# Patient Record
Sex: Female | Born: 1970 | Race: White | Hispanic: No | Marital: Married | Smoking: Never smoker
Health system: Southern US, Community
[De-identification: ages and names within clinical notes are randomized; demographics above are authoritative.]

## PROBLEM LIST (undated history)

## (undated) DIAGNOSIS — I10 Essential (primary) hypertension: Secondary | ICD-10-CM

---

## 1997-10-01 ENCOUNTER — Observation Stay (HOSPITAL_COMMUNITY): Admission: AD | Admit: 1997-10-01 | Discharge: 1997-10-01 | Payer: Self-pay | Admitting: Obstetrics and Gynecology

## 1997-10-13 ENCOUNTER — Other Ambulatory Visit: Admission: RE | Admit: 1997-10-13 | Discharge: 1997-10-13 | Payer: Self-pay | Admitting: Obstetrics and Gynecology

## 1997-10-25 ENCOUNTER — Ambulatory Visit (HOSPITAL_COMMUNITY): Admission: AD | Admit: 1997-10-25 | Discharge: 1997-10-25 | Payer: Self-pay | Admitting: *Deleted

## 1998-06-20 ENCOUNTER — Other Ambulatory Visit: Admission: RE | Admit: 1998-06-20 | Discharge: 1998-06-20 | Payer: Self-pay | Admitting: Obstetrics and Gynecology

## 1999-01-27 ENCOUNTER — Inpatient Hospital Stay (HOSPITAL_COMMUNITY): Admission: AD | Admit: 1999-01-27 | Discharge: 1999-01-29 | Payer: Self-pay | Admitting: Obstetrics and Gynecology

## 1999-03-01 ENCOUNTER — Other Ambulatory Visit: Admission: RE | Admit: 1999-03-01 | Discharge: 1999-03-01 | Payer: Self-pay | Admitting: Obstetrics and Gynecology

## 2000-06-03 ENCOUNTER — Other Ambulatory Visit: Admission: RE | Admit: 2000-06-03 | Discharge: 2000-06-03 | Payer: Self-pay | Admitting: Obstetrics and Gynecology

## 2000-08-21 ENCOUNTER — Encounter: Admission: RE | Admit: 2000-08-21 | Discharge: 2000-11-19 | Payer: Self-pay | Admitting: Obstetrics and Gynecology

## 2002-06-11 ENCOUNTER — Other Ambulatory Visit: Admission: RE | Admit: 2002-06-11 | Discharge: 2002-06-11 | Payer: Self-pay | Admitting: Obstetrics and Gynecology

## 2002-11-24 ENCOUNTER — Inpatient Hospital Stay (HOSPITAL_COMMUNITY): Admission: AD | Admit: 2002-11-24 | Discharge: 2002-11-28 | Payer: Self-pay | Admitting: Obstetrics and Gynecology

## 2002-12-09 ENCOUNTER — Inpatient Hospital Stay (HOSPITAL_COMMUNITY): Admission: AD | Admit: 2002-12-09 | Discharge: 2002-12-09 | Payer: Self-pay | Admitting: Obstetrics and Gynecology

## 2003-01-08 ENCOUNTER — Other Ambulatory Visit: Admission: RE | Admit: 2003-01-08 | Discharge: 2003-01-08 | Payer: Self-pay | Admitting: Obstetrics and Gynecology

## 2010-10-26 ENCOUNTER — Other Ambulatory Visit: Payer: Self-pay | Admitting: Obstetrics and Gynecology

## 2011-06-18 DIAGNOSIS — L259 Unspecified contact dermatitis, unspecified cause: Secondary | ICD-10-CM | POA: Insufficient documentation

## 2012-02-13 ENCOUNTER — Ambulatory Visit (HOSPITAL_COMMUNITY): Payer: Self-pay

## 2012-02-14 ENCOUNTER — Ambulatory Visit (HOSPITAL_COMMUNITY)
Admission: RE | Admit: 2012-02-14 | Discharge: 2012-02-14 | Disposition: A | Discharge status: Home / Self Care | Payer: Managed Care, Other (non HMO) | Source: Ambulatory Visit | Attending: Obstetrics and Gynecology | Admitting: Obstetrics and Gynecology

## 2012-02-14 DIAGNOSIS — I10 Essential (primary) hypertension: Secondary | ICD-10-CM | POA: Insufficient documentation

## 2012-02-15 ENCOUNTER — Ambulatory Visit (HOSPITAL_COMMUNITY): Admission: RE | Admit: 2012-02-15 | Payer: Self-pay | Source: Ambulatory Visit

## 2015-07-20 ENCOUNTER — Other Ambulatory Visit: Payer: Self-pay | Admitting: Physician Assistant

## 2017-04-29 ENCOUNTER — Emergency Department (HOSPITAL_BASED_OUTPATIENT_CLINIC_OR_DEPARTMENT_OTHER): Payer: Managed Care, Other (non HMO)

## 2017-04-29 ENCOUNTER — Emergency Department (HOSPITAL_BASED_OUTPATIENT_CLINIC_OR_DEPARTMENT_OTHER)
Admission: EM | Admit: 2017-04-29 | Discharge: 2017-04-29 | Disposition: A | Discharge status: Home / Self Care | Payer: Managed Care, Other (non HMO) | Attending: Emergency Medicine | Admitting: Emergency Medicine

## 2017-04-29 ENCOUNTER — Other Ambulatory Visit: Payer: Self-pay

## 2017-04-29 ENCOUNTER — Encounter (HOSPITAL_BASED_OUTPATIENT_CLINIC_OR_DEPARTMENT_OTHER): Payer: Self-pay | Admitting: *Deleted

## 2017-04-29 DIAGNOSIS — I10 Essential (primary) hypertension: Secondary | ICD-10-CM | POA: Insufficient documentation

## 2017-04-29 DIAGNOSIS — L03115 Cellulitis of right lower limb: Secondary | ICD-10-CM | POA: Insufficient documentation

## 2017-04-29 DIAGNOSIS — M79604 Pain in right leg: Secondary | ICD-10-CM | POA: Diagnosis present

## 2017-04-29 HISTORY — DX: Essential (primary) hypertension: I10

## 2017-04-29 MED ORDER — CEPHALEXIN 500 MG PO CAPS: 500.0000 mg | ORAL_CAPSULE | Freq: Four times a day (QID) | ORAL | 0 refills | Status: DC

## 2017-04-29 NOTE — ED Triage Notes (Signed)
Right knee was swollen last week. The swelling went down and now her upper leg is swollen. She has a rash in one area.

## 2017-04-29 NOTE — ED Provider Notes (Signed)
[facility] EMERGENCY DEPARTMENT Provider Note   CSN: [id number] Arrival date & time: 04/29/17  1641     History   Chief Complaint Chief Complaint  Patient presents with  . Leg Pain    HPI Stacy Rivers is a 46 y.o. female w/ h/o vericose veins presents to ED for evaluation of focal area of redness, warmth, tenderness to right medial mid thigh x 2 days. She feels like there is a knot under the skin. States one week ago she hit her right knee and it swelled up, turned purple/blue and now it feels like there is also a  knot above her right knee cap. She is afraid she has a blood clot. Denies fevers, chills, trauma to mid thigh, previous h/o DVT/PE.  No numbness or tingling distally. Denies IVDU. States she has a thick rope like vein in this area of her thigh normally gets larger when standing up for prolonged periods of time. Has been told to go to vascular but has not because it usually doesn't bother her. No recent immobilization, estrogen use, h/o malignancy.  HPI  Past Medical History:  Diagnosis Date  . Hypertension     There are no active problems to display for this patient.   Past Surgical History:  Procedure Laterality Date  . CESAREAN SECTION      OB History    No data available       Home Medications    Prior to Admission medications   Medication Sig Start Date End Date Taking? Authorizing Provider  LISINOPRIL PO Take by mouth.   Yes [provider]  cephALEXin (KEFLEX) 500 MG capsule Take 1 capsule (500 mg total) by mouth 4 (four) times daily. 04/29/17   Liberty Handy, PA-C    Family History No family history on file.  Social History Social History   Tobacco Use  . Smoking status: Never Smoker  . Smokeless tobacco: Never Used  Substance Use Topics  . Alcohol use: No    Frequency: Never  . Drug use: No     Allergies   Patient has no known allergies.   Review of Systems Review of Systems  Skin: Positive for color  change.       +swelling +warmth  All other systems reviewed and are negative.    Physical Exam Updated Vital Signs BP (!) 143/96 (BP Location: Left Arm)   Pulse 73   Temp 98.4 F (36.9 C) (Oral)   Resp 18   Ht 5\' 6"  (1.676 m)   Wt 86.2 kg (190 lb)   LMP 04/07/2017   SpO2 99%   BMI 30.67 kg/m   Physical Exam  Constitutional: She is oriented to person, place, and time. She appears well-developed and well-nourished. No distress.  NAD.  HENT:  Head: Normocephalic and atraumatic.  Right Ear: External ear normal.  Left Ear: External ear normal.  Nose: Nose normal.  Eyes: Conjunctivae and EOM are normal. No scleral icterus.  Neck: Normal range of motion. Neck supple.  Cardiovascular: Normal rate, regular rhythm and normal heart sounds.  No murmur heard. Pulmonary/Chest: Effort normal and breath sounds normal. She has no wheezes.  Musculoskeletal: Normal range of motion. She exhibits no deformity.  Neurological: She is alert and oriented to person, place, and time.  Skin: Skin is warm and dry. Capillary refill takes less than 2 seconds. There is erythema.  Area of erythema and warmth to right medial mid thigh with well demarcated borders, blanchable. There is area  of induration/firmness in the center. No obvious superficial abscess. No visible vericosities. No calf edema or tenderness. No LE edema   Psychiatric: She has a normal mood and affect. Her behavior is normal. Judgment and thought content normal.  Nursing note and vitals reviewed.    ED Treatments / Results  Labs (all labs ordered are listed, but only abnormal results are displayed) Labs Reviewed - No data to display  EKG  EKG Interpretation None       Radiology [place] Venous Img Lower Right (dvt Study)  Result Date: 04/29/2017 CLINICAL DATA:  RIGHT lower extremity pain and swelling for 2 days, history hypertension EXAM: RIGHT LOWER EXTREMITY VENOUS DOPPLER ULTRASOUND TECHNIQUE: Gray-scale sonography with graded  compression, as well as color Doppler and duplex ultrasound were performed to evaluate the lower extremity deep venous systems from the level of the common femoral vein and including the common femoral, femoral, profunda femoral, popliteal and calf veins including the posterior tibial, peroneal and gastrocnemius veins when visible. The superficial great saphenous vein was also interrogated. Spectral Doppler was utilized to evaluate flow at rest and with distal augmentation maneuvers in the common femoral, femoral and popliteal veins. COMPARISON:  None FINDINGS: Contralateral Common Femoral Vein: Respiratory phasicity is normal and symmetric with the symptomatic side. No evidence of thrombus. Normal compressibility. Common Femoral Vein: No evidence of thrombus. Normal compressibility, respiratory phasicity and response to augmentation. Saphenofemoral Junction: No evidence of thrombus. Normal compressibility and flow on color Doppler imaging. Profunda Femoral Vein: No evidence of thrombus. Normal compressibility and flow on color Doppler imaging. Femoral Vein: No evidence of thrombus. Normal compressibility, respiratory phasicity and response to augmentation. Popliteal Vein: No evidence of thrombus. Normal compressibility, respiratory phasicity and response to augmentation. Calf Veins: No evidence of thrombus. Normal compressibility and flow on color Doppler imaging. Superficial Great Saphenous Vein: No evidence of thrombus. Normal compressibility. Venous Reflux:  None. Other Findings:  None. IMPRESSION: No evidence of deep venous thrombosis in the RIGHT lower extremity. Electronically Signed   By: Ulyses SouthwardMark  Boles M.D.   On: 04/29/2017 20:13    Procedures Procedures (including critical care time)  Medications Ordered in ED Medications - No data to display   Initial Impression / Assessment and Plan / ED Course  I have reviewed the triage vital signs and the nursing notes.  Pertinent labs & imaging results that  were available during my care of the patient were reviewed by me and considered in my medical decision making (see chart for details).    46 year old presents with focal area of erythema, edema, warmth with induration/firmness to the center 2 days. No obvious fluctuance to suggest abscess that would be amenable to I&D today. Reports history of varicose veins to this area. No previous history of DVT/PE. No recent surgery, immobilization, malignancy, estrogen use. Extremities neurovascularly intact distally. No trauma. Denies IV drug use. History, exam was consistent with cellulitis. Given history of varicose veins and induration to the central part  Lower extremity ultrasound was obtained to rule out DVT. Ultrasound today was negative. Patient does shave her legs, suspect cellulitis. Will discharge with antibiotics. Area of erythema was marked with a pen today. Discussed return precautions.  Final Clinical Impressions(s) / ED Diagnoses   Final diagnoses:  Cellulitis of right lower extremity    ED Discharge Orders        Ordered    cephALEXin (KEFLEX) 500 MG capsule  4 times daily     04/29/17 2020  Liberty HandyGibbons, Shanta Dorvil J, PA-C 04/29/17 2025    Charlynne PanderYao, David Hsienta, MD 04/29/17 [phone]

## 2017-04-29 NOTE — Discharge Instructions (Addendum)
Ultrasound today was negative, there is no evidence of blood clots. I suspect the redness, warmth and tenderness to right leg is from a superficial skin infection, cellulitis. This can occur from introduction of bacteria into skin. Please take antibiotic as prescribed. Take ibuprofen and Tylenol for swelling and pain. Give antibiotics 2-3 days to decrease redness, if redness goes past the marked lines after 2-3 days of taking antibiotics please follow-up for reevaluation.

## 2018-03-07 IMAGING — US US EXTREM LOW VENOUS*R*
1 series · 13 of 24 positions shown · non-contrast
Comparison: None

CLINICAL DATA: RIGHT lower extremity pain and swelling for 2 days,
history hypertension



[Series 1: us extrem low venous*right* · 0.08mm/px · 13 of 27 slices shown]
[im 1/27]
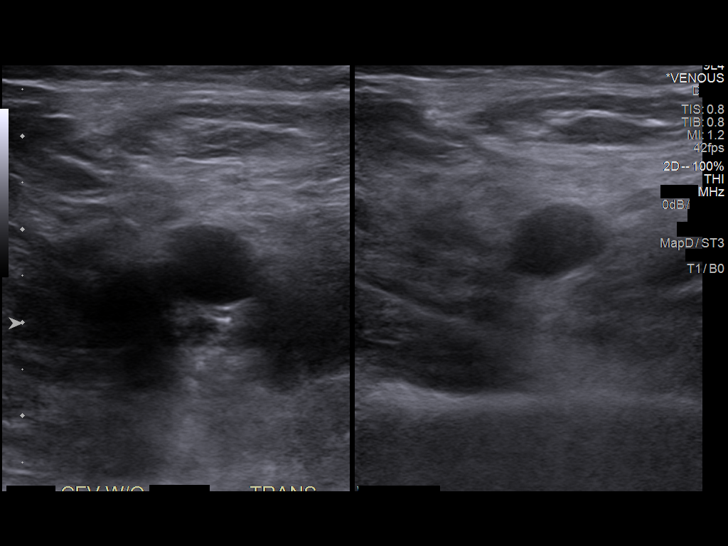
[im 3/27]
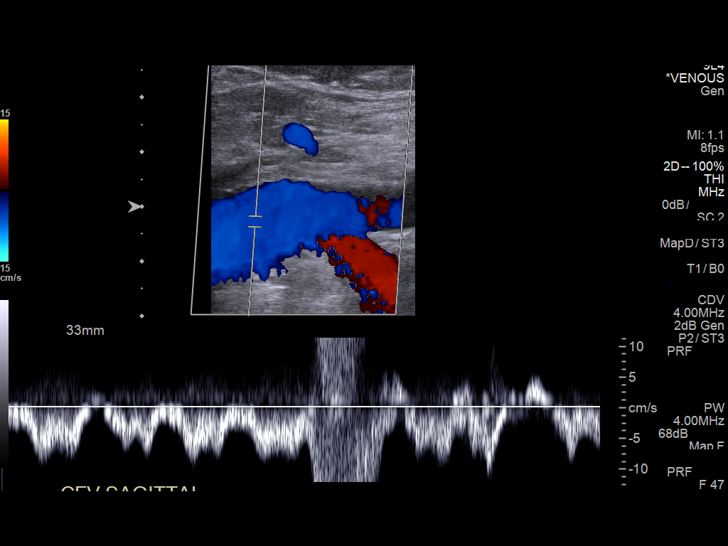
[im 5/27]
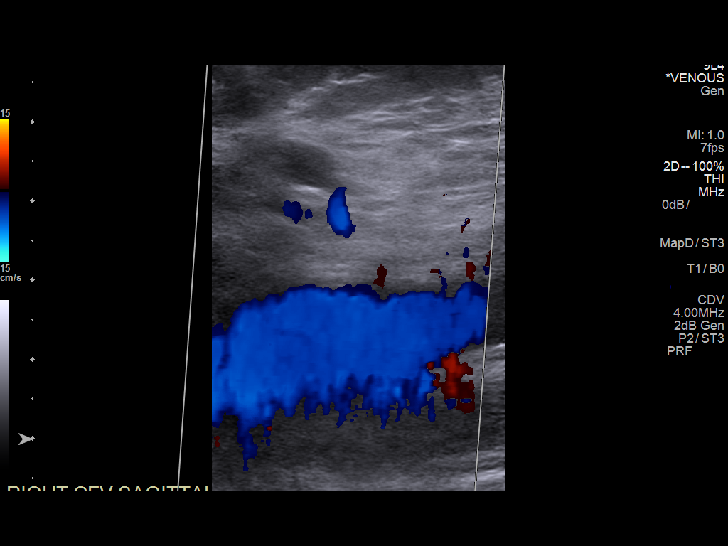
[im 7/27]
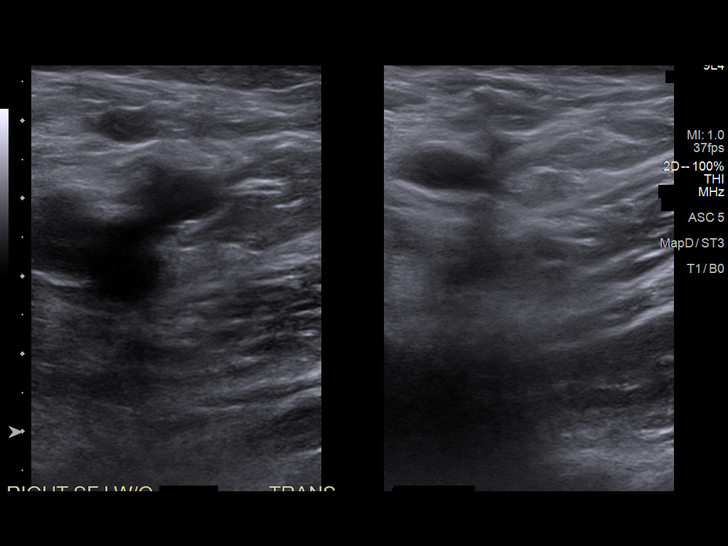
[im 10/27]
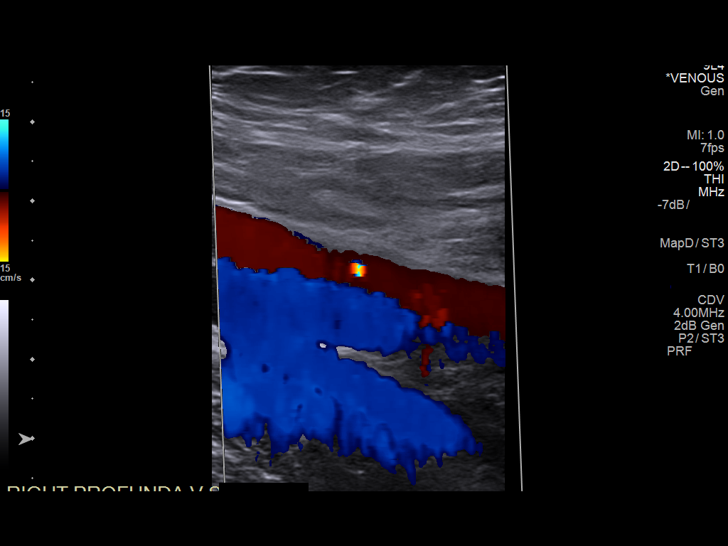
[im 12/27]
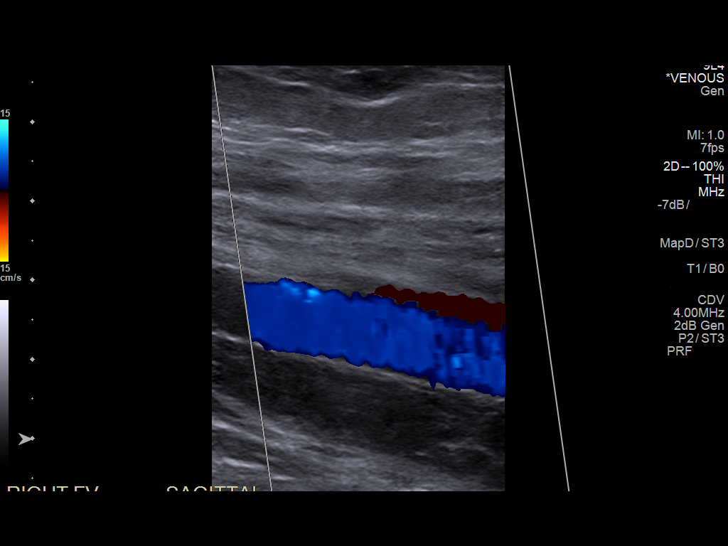
[im 14/27]
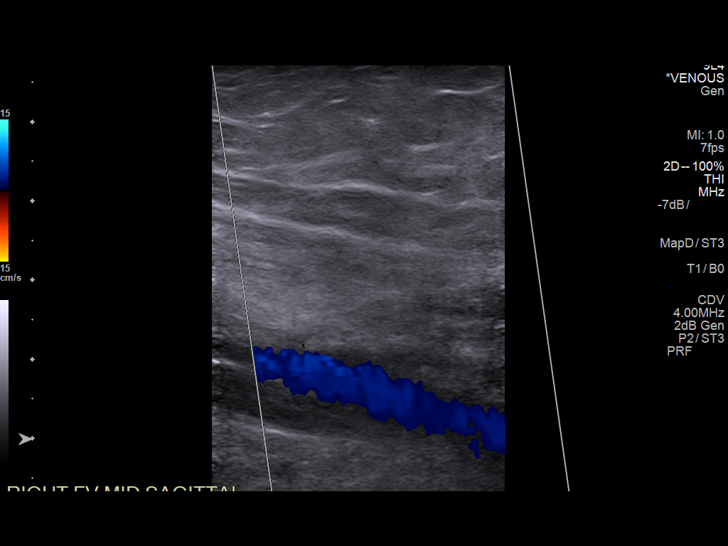
[im 15/27]
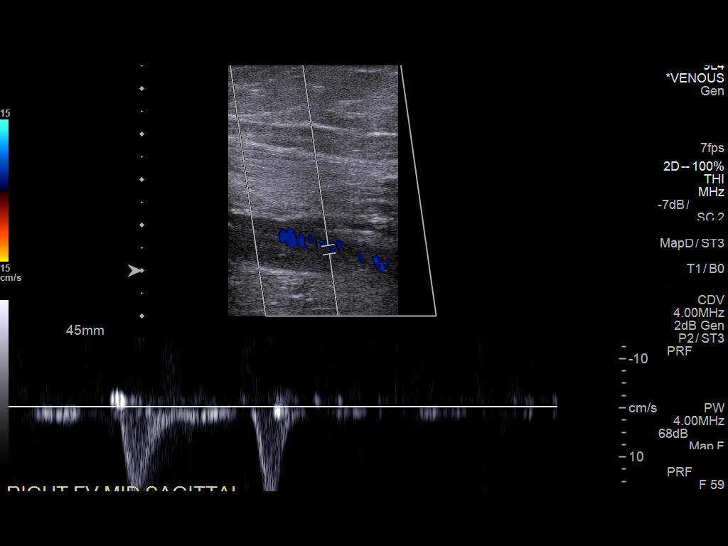
[im 17/27]
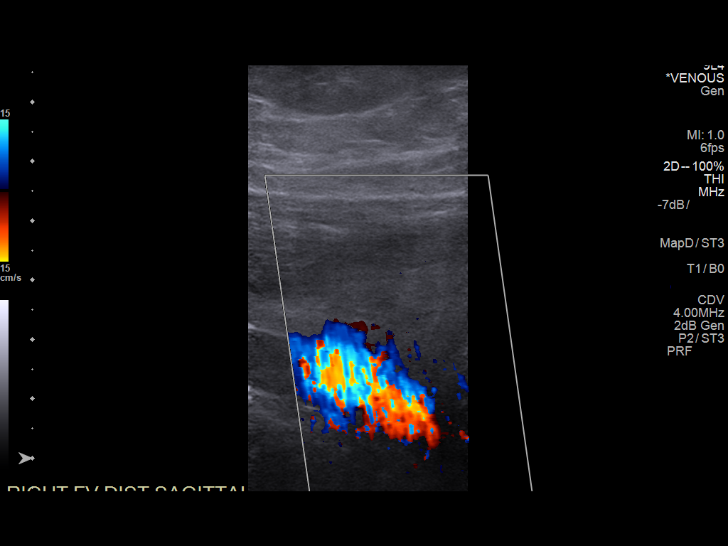
[im 20/27]
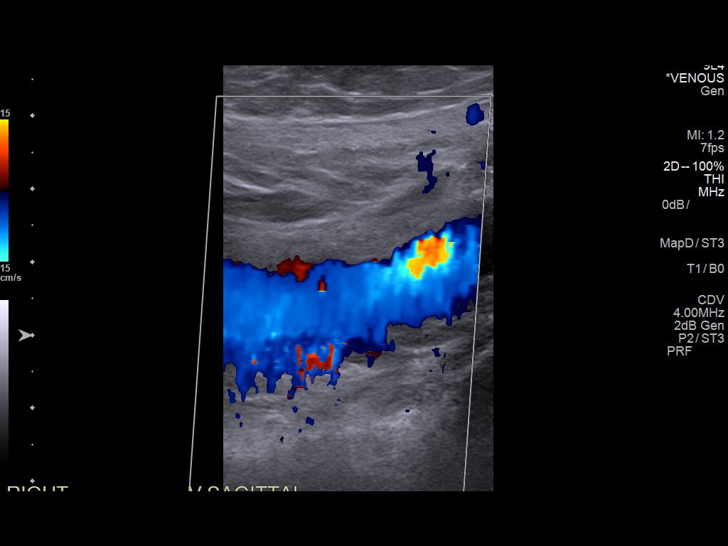
[im 22/27]
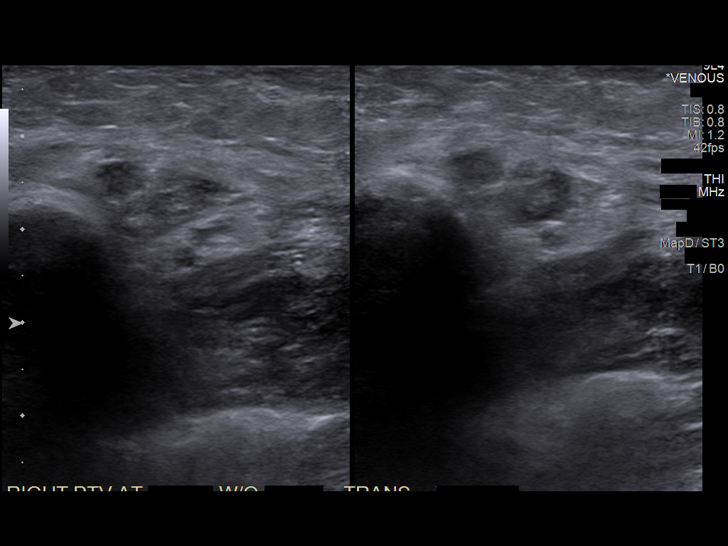
[im 24/27]
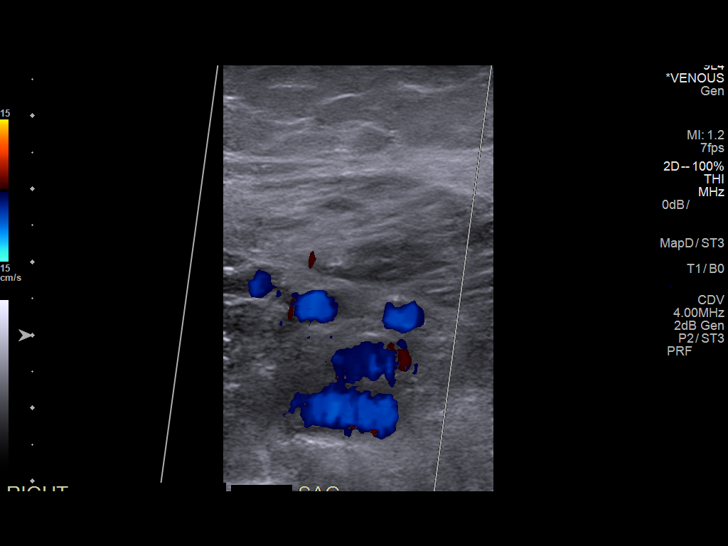
[im 27/27]
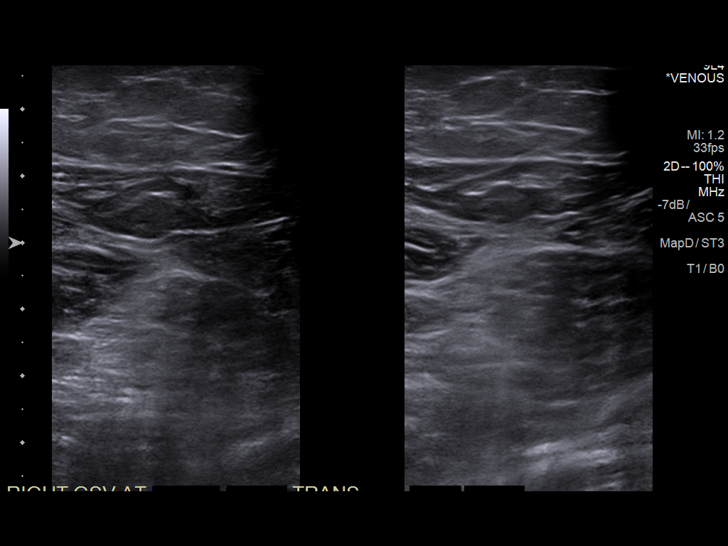

[13 of 24 positions shown; findings below may reference images not displayed]

FINDINGS: Contralateral Common Femoral Vein: Respiratory phasicity is normal
and symmetric with the symptomatic side. No evidence of thrombus.
Normal compressibility.

Common Femoral Vein: No evidence of thrombus. Normal
compressibility, respiratory phasicity and response to augmentation.

Saphenofemoral Junction: No evidence of thrombus. Normal
compressibility and flow on color Doppler imaging.

Profunda Femoral Vein: No evidence of thrombus. Normal
compressibility and flow on color Doppler imaging.

Femoral Vein: No evidence of thrombus. Normal compressibility,
respiratory phasicity and response to augmentation.

Popliteal Vein: No evidence of thrombus. Normal compressibility,
respiratory phasicity and response to augmentation.

Calf Veins: No evidence of thrombus. Normal compressibility and flow
on color Doppler imaging.

Superficial Great Saphenous Vein: No evidence of thrombus. Normal
compressibility.

Venous Reflux:  None.

Other Findings:  None.
IMPRESSION: No evidence of deep venous thrombosis in the RIGHT lower extremity.

## 2020-11-14 DIAGNOSIS — I10 Essential (primary) hypertension: Secondary | ICD-10-CM | POA: Insufficient documentation

## 2020-11-16 DIAGNOSIS — Z803 Family history of malignant neoplasm of breast: Secondary | ICD-10-CM | POA: Insufficient documentation

## 2020-11-16 DIAGNOSIS — N951 Menopausal and female climacteric states: Secondary | ICD-10-CM | POA: Insufficient documentation

## 2020-11-16 DIAGNOSIS — R61 Generalized hyperhidrosis: Secondary | ICD-10-CM | POA: Insufficient documentation

## 2020-12-20 LAB — COLOGUARD: COLOGUARD: NEGATIVE

## 2022-09-18 DIAGNOSIS — N8110 Cystocele, unspecified: Secondary | ICD-10-CM | POA: Insufficient documentation

## 2022-12-10 DIAGNOSIS — N951 Menopausal and female climacteric states: Secondary | ICD-10-CM | POA: Insufficient documentation

## 2024-01-02 DIAGNOSIS — Z1331 Encounter for screening for depression: Secondary | ICD-10-CM | POA: Diagnosis not present

## 2024-01-02 DIAGNOSIS — Z01419 Encounter for gynecological examination (general) (routine) without abnormal findings: Secondary | ICD-10-CM | POA: Diagnosis not present

## 2024-01-02 DIAGNOSIS — Z0000 Encounter for general adult medical examination without abnormal findings: Secondary | ICD-10-CM | POA: Diagnosis not present

## 2024-01-02 DIAGNOSIS — Z1231 Encounter for screening mammogram for malignant neoplasm of breast: Secondary | ICD-10-CM | POA: Diagnosis not present

## 2024-02-17 ENCOUNTER — Telehealth: Payer: Self-pay

## 2024-02-17 NOTE — Telephone Encounter (Signed)
 This patient mentioned at front desk she was told she would be able to establish as a new patient with you? Patient was with Husband at his appointment with you this morning Charlie Smalling.  Was this verified with you?

## 2024-02-17 NOTE — Telephone Encounter (Signed)
 Patient mentioned during husband's check out that she was approved to establish care as a NP. Please advise

## 2024-02-17 NOTE — Telephone Encounter (Signed)
 Ok to establish

## 2024-02-17 NOTE — Telephone Encounter (Signed)
 Call to schedule np appt - LM to return call

## 2024-02-19 ENCOUNTER — Encounter: Payer: Self-pay | Admitting: Gastroenterology

## 2024-02-19 NOTE — Telephone Encounter (Signed)
 Pt returned call and I got her scheduled

## 2024-02-28 ENCOUNTER — Ambulatory Visit (AMBULATORY_SURGERY_CENTER)

## 2024-02-28 VITALS — Ht 66.0 in | Wt 193.2 lb

## 2024-02-28 DIAGNOSIS — Z1211 Encounter for screening for malignant neoplasm of colon: Secondary | ICD-10-CM

## 2024-02-28 MED ORDER — NA SULFATE-K SULFATE-MG SULF 17.5-3.13-1.6 GM/177ML PO SOLN: 1.0000 | Freq: Once | ORAL | 0 refills | Status: AC

## 2024-02-28 NOTE — Progress Notes (Signed)

## 2024-03-04 ENCOUNTER — Encounter: Payer: Self-pay | Admitting: Gastroenterology

## 2024-03-16 ENCOUNTER — Encounter: Payer: Self-pay | Admitting: Gastroenterology

## 2024-03-16 ENCOUNTER — Ambulatory Visit (AMBULATORY_SURGERY_CENTER): Admitting: Gastroenterology

## 2024-03-16 VITALS — BP 143/77 | HR 63 | Temp 98.1°F | Resp 19 | Ht 66.0 in | Wt 193.2 lb

## 2024-03-16 DIAGNOSIS — Z1211 Encounter for screening for malignant neoplasm of colon: Secondary | ICD-10-CM | POA: Diagnosis not present

## 2024-03-16 DIAGNOSIS — K5730 Diverticulosis of large intestine without perforation or abscess without bleeding: Secondary | ICD-10-CM

## 2024-03-16 DIAGNOSIS — D123 Benign neoplasm of transverse colon: Secondary | ICD-10-CM

## 2024-03-16 MED ORDER — SODIUM CHLORIDE 0.9 % IV SOLN: 500.0000 mL | Freq: Once | INTRAVENOUS | Status: DC

## 2024-03-16 NOTE — Progress Notes (Signed)
 To pacu, VSS. Report to [email or web address]

## 2024-03-16 NOTE — Progress Notes (Signed)
 [facility] History and Physical   Primary Care Physician:  Kandyce Sor, MD   Reason for Procedure:   Colon cancer screening  Plan:    Screening colonoscopy     HPI: Stacy Rivers is a 53 y.o. female undergoing initial average risk screening colonoscopy.  She has no family history of colon cancer and no chronic GI symptoms.    Past Medical History:  Diagnosis Date   Hypertension     Past Surgical History:  Procedure Laterality Date   CESAREAN SECTION      Prior to Admission medications   Medication Sig Start Date End Date Taking? Authorizing Provider  cholecalciferol (VITAMIN D3) 25 MCG (1000 UNIT) tablet Take 1,000 Units by mouth daily.   Yes [provider]  lisinopril-hydrochlorothiazide (ZESTORETIC) 10-12.5 MG tablet Take 1 tablet by mouth daily.   Yes [provider]  LYLLANA 0.075 MG/24HR Place 1 patch onto the skin 2 (two) times a week. 01/02/24  Yes [provider]  Omega-3 Fatty Acids (FISH OIL) 1000 MG CAPS Take 2 g by mouth daily.   Yes [provider]  progesterone (PROMETRIUM) 100 MG capsule Take 100 mg by mouth at bedtime.   Yes [provider]  ibuprofen (ADVIL) 600 MG tablet Take 600 mg by mouth every 8 (eight) hours as needed. 03/16/22   [provider]    Current Outpatient Medications  Medication Sig Dispense Refill   cholecalciferol (VITAMIN D3) 25 MCG (1000 UNIT) tablet Take 1,000 Units by mouth daily.     lisinopril-hydrochlorothiazide (ZESTORETIC) 10-12.5 MG tablet Take 1 tablet by mouth daily.     LYLLANA 0.075 MG/24HR Place 1 patch onto the skin 2 (two) times a week.     Omega-3 Fatty Acids (FISH OIL) 1000 MG CAPS Take 2 g by mouth daily.     progesterone (PROMETRIUM) 100 MG capsule Take 100 mg by mouth at bedtime.     ibuprofen (ADVIL) 600 MG tablet Take 600 mg by mouth every 8 (eight) hours as needed.     Current Facility-Administered Medications  Medication Dose Route  Frequency Provider Last Rate Last Admin   0.9 %  sodium chloride infusion  500 mL Intravenous Once Stacy Glendia BRAVO, MD        Allergies as of 03/16/2024   (No Known Allergies)    Family History  Problem Relation Age of Onset   Colon cancer Neg Hx    Rectal cancer Neg Hx    Stomach cancer Neg Hx    Esophageal cancer Neg Hx     Social History   Socioeconomic History   Marital status: Married    Spouse name: Not on file   Number of children: Not on file   Years of education: Not on file   Highest education level: Not on file  Occupational History   Not on file  Tobacco Use   Smoking status: Never   Smokeless tobacco: Never  Vaping Use   Vaping status: Never Used  Substance and Sexual Activity   Alcohol use: Yes    Comment: Very  rare   Drug use: No   Sexual activity: Not on file  Other Topics Concern   Not on file  Social History Narrative   Not on file   Social Drivers of Health   Financial Resource Strain: Not on file  Food Insecurity: Not on file  Transportation Needs: Not on file  Physical Activity: Not on file  Stress: Not on file  Social Connections:  Unknown (10/06/2021)   Received from [facility]   Social Network    Social Network: Not on file  Intimate Partner Violence: Unknown (08/28/2021)   Received from [facility]   HITS    Physically Hurt: Not on file    Insult or Talk Down To: Not on file    Threaten Physical Harm: Not on file    Scream or Curse: Not on file    Review of Systems:  All other review of systems negative except as mentioned in the HPI.  Physical Exam: Vital signs BP (!) 140/76   Pulse (!) 50   Temp 98.1 F (36.7 C) (Skin)   Ht 5' 6 (1.676 m)   Wt 193 lb 3.2 oz (87.6 kg)   SpO2 97%   BMI 31.18 kg/m   General:   Alert,  Well-developed, well-nourished, pleasant and cooperative in NAD Airway:  Mallampati 2 Lungs:  Clear throughout to auscultation.   Heart:  Regular rate and rhythm; no murmurs, clicks, rubs,  or  gallops. Abdomen:  Soft, nontender and nondistended. Normal bowel sounds.   Neuro/Psych:  Normal mood and affect. A and O x 3   Jaton Eilers E. Stacia, MD [facility]

## 2024-03-16 NOTE — Progress Notes (Signed)
 Pt's states no medical or surgical changes since previsit or office visit.

## 2024-03-16 NOTE — Op Note (Signed)
 [facility] Patient Name: Stacy Rivers Procedure Date: 03/16/2024 10:23 AM MRN: [id number] Endoscopist: Glendia E. Stacia , MD, [id number] Age: 53 Referring MD:  Date of Birth: 09-05-1970 Gender: Female Account #: [id number] Procedure:                Colonoscopy Indications:              Screening for colorectal malignant neoplasm, This                            is the patient's first colonoscopy Medicines:                Monitored Anesthesia Care Procedure:                Pre-Anesthesia Assessment:                           - Prior to the procedure, a History and Physical                            was performed, and patient medications and                            allergies were reviewed. The patient's tolerance of                            previous anesthesia was also reviewed. The risks                            and benefits of the procedure and the sedation                            options and risks were discussed with the patient.                            All questions were answered, and informed consent                            was obtained. Prior Anticoagulants: The patient has                            taken no anticoagulant or antiplatelet agents. ASA                            Grade Assessment: II - A patient with mild systemic                            disease. After reviewing the risks and benefits,                            the patient was deemed in satisfactory condition to                            undergo the procedure.  After obtaining informed consent, the colonoscope                            was passed under direct vision. Throughout the                            procedure, the patient's blood pressure, pulse, and                            oxygen saturations were monitored continuously. The                            CF [id number] #[id number] was introduced through the anus                            and advanced  to the the cecum, identified by                            appendiceal orifice and ileocecal valve. The                            colonoscopy was performed without difficulty. The                            patient tolerated the procedure well. The quality                            of the bowel preparation was good. The ileocecal                            valve, appendiceal orifice, and rectum were                            photographed. The bowel preparation used was SUPREP                            via split dose instruction. Scope In: 10:35:45 AM Scope Out: 10:50:21 AM Scope Withdrawal Time: 0 hours 10 minutes 51 seconds  Total Procedure Duration: 0 hours 14 minutes 36 seconds  Findings:                 The perianal and digital rectal examinations were                            normal. Pertinent negatives include normal                            sphincter tone and no palpable rectal lesions.                           Two sessile polyps were found in the distal                            transverse colon. The polyps were 4 to 5 mm in  size. These polyps were removed with a cold snare.                            Resection and retrieval were complete. Estimated                            blood loss was minimal.                           Many medium-mouthed and small-mouthed diverticula                            were found in the sigmoid colon, descending colon                            and transverse colon.                           The exam was otherwise normal throughout the                            examined colon.                           The retroflexed view of the distal rectum and anal                            verge was normal and showed no anal or rectal                            abnormalities. Complications:            No immediate complications. Estimated Blood Loss:     Estimated blood loss was minimal. Impression:               - Two 4  to 5 mm polyps in the distal transverse                            colon, removed with a cold snare. Resected and                            retrieved.                           - Moderate diverticulosis in the sigmoid colon, in                            the descending colon and in the transverse colon.                           - The distal rectum and anal verge are normal on                            retroflexion view. Recommendation:           - Patient has a contact number available for  emergencies. The signs and symptoms of potential                            delayed complications were discussed with the                            patient. Return to normal activities tomorrow.                            Written discharge instructions were provided to the                            patient.                           - Resume previous diet.                           - Continue present medications.                           - Await pathology results.                           - Repeat colonoscopy (date not yet determined) for                            surveillance based on pathology results.                           - Recommend high fiber diet/daily fiber supplement                            to reduce risk of diverticular complications. Ernesta Trabert E. Stacia, MD 03/16/2024 10:55:29 AM This report has been signed electronically.

## 2024-03-16 NOTE — Progress Notes (Signed)
 Called to room to assist during endoscopic procedure.  Patient ID and intended procedure confirmed with present staff. Received instructions for my participation in the procedure from the performing physician.

## 2024-03-16 NOTE — Patient Instructions (Addendum)
 Resume previous diet  Continue present medications  Await pathology results  Repeat colonoscopy (date not yet determined) for surveillance based on pathology results  Recommend high fiver diet/daily fiber supplement  See handouts on polyps and diverticulosis  YOU HAD AN ENDOSCOPIC PROCEDURE TODAY AT THE [facility]:   Refer to the procedure report that was given to you for any specific questions about what was found during the examination.  If the procedure report does not answer your questions, please call your gastroenterologist to clarify.  If you requested that your care partner not be given the details of your procedure findings, then the procedure report has been included in a sealed envelope for you to review at your convenience later.  YOU SHOULD EXPECT: Some feelings of bloating in the abdomen. Passage of more gas than usual.  Walking can help get rid of the air that was put into your GI tract during the procedure and reduce the bloating. If you had a lower endoscopy (such as a colonoscopy or flexible sigmoidoscopy) you may notice spotting of blood in your stool or on the toilet paper. If you underwent a bowel prep for your procedure, you may not have a normal bowel movement for a few days.  Please Note:  You might notice some irritation and congestion in your nose or some drainage.  This is from the oxygen used during your procedure.  There is no need for concern and it should clear up in a day or so.  SYMPTOMS TO REPORT IMMEDIATELY: Following lower endoscopy (colonoscopy or flexible sigmoidoscopy):  Excessive amounts of blood in the stool  Significant tenderness or worsening of abdominal pains  Swelling of the abdomen that is new, acute  Fever of 100F or higher  For urgent or emergent issues, a gastroenterologist can be reached at any hour by calling [phone]. Do not use MyChart messaging for urgent concerns.   DIET:  We do recommend a small meal at first, but  then you may proceed to your regular diet.  Drink plenty of fluids but you should avoid alcoholic beverages for 24 hours.  ACTIVITY:  You should plan to take it easy for the rest of today and you should NOT DRIVE or use heavy machinery until tomorrow (because of the sedation medicines used during the test).    FOLLOW UP: Our staff will call the number listed on your records the next business day following your procedure.  We will call around 7:15- 8:00 am to check on you and address any questions or concerns that you may have regarding the information given to you following your procedure. If we do not reach you, we will leave a message.     If any biopsies were taken you will be contacted by phone or by letter within the next 1-3 weeks.  Please call us  at [phone] if you have not heard about the biopsies in 3 weeks.   SIGNATURES/CONFIDENTIALITY: You and/or your care partner have signed paperwork which will be entered into your electronic medical record.  These signatures attest to the fact that that the information above on your After Visit Summary has been reviewed and is understood.  Full responsibility of the confidentiality of this discharge information lies with you and/or your care-partner.

## 2024-03-17 ENCOUNTER — Telehealth: Payer: Self-pay

## 2024-03-17 NOTE — Telephone Encounter (Signed)
  Follow up Call-     03/16/2024    9:21 AM  Call back number  Post procedure Call Back phone  # [phone]  Permission to leave phone message Yes     Patient questions:  Do you have a fever, pain , or abdominal swelling? No. Pain Score  0 *  Have you tolerated food without any problems? Yes.    Have you been able to return to your normal activities? Yes.    Do you have any questions about your discharge instructions: Diet   No. Medications  No. Follow up visit  No.  Do you have questions or concerns about your Care? No.  Actions: * If pain score is 4 or above: No action needed, pain <4.

## 2024-03-18 LAB — SURGICAL PATHOLOGY

## 2024-03-19 ENCOUNTER — Ambulatory Visit: Payer: Self-pay | Admitting: Gastroenterology

## 2024-03-23 ENCOUNTER — Encounter: Payer: Self-pay | Admitting: Family Medicine

## 2024-03-23 ENCOUNTER — Ambulatory Visit (INDEPENDENT_AMBULATORY_CARE_PROVIDER_SITE_OTHER): Payer: Self-pay | Admitting: Family Medicine

## 2024-03-23 VITALS — BP 108/68 | HR 57 | Temp 97.9°F | Ht 66.0 in | Wt 191.2 lb

## 2024-03-23 DIAGNOSIS — E669 Obesity, unspecified: Secondary | ICD-10-CM | POA: Insufficient documentation

## 2024-03-23 DIAGNOSIS — I10 Essential (primary) hypertension: Secondary | ICD-10-CM | POA: Diagnosis not present

## 2024-03-23 DIAGNOSIS — Z114 Encounter for screening for human immunodeficiency virus [HIV]: Secondary | ICD-10-CM

## 2024-03-23 DIAGNOSIS — Z1159 Encounter for screening for other viral diseases: Secondary | ICD-10-CM

## 2024-03-23 DIAGNOSIS — Z6830 Body mass index (BMI) 30.0-30.9, adult: Secondary | ICD-10-CM | POA: Diagnosis not present

## 2024-03-23 LAB — CBC WITH DIFFERENTIAL/PLATELET
Basophils Absolute: 0 K/uL (ref 0.0–0.1)
Basophils Relative: 0.7 % (ref 0.0–3.0)
Eosinophils Absolute: 0.1 K/uL (ref 0.0–0.7)
Eosinophils Relative: 1.7 % (ref 0.0–5.0)
HCT: 39.7 % (ref 36.0–46.0)
Hemoglobin: 13.3 g/dL (ref 12.0–15.0)
Lymphocytes Relative: 25.1 % (ref 12.0–46.0)
Lymphs Abs: 1.6 K/uL (ref 0.7–4.0)
MCHC: 33.6 g/dL (ref 30.0–36.0)
MCV: 84.2 fl (ref 78.0–100.0)
Monocytes Absolute: 0.5 K/uL (ref 0.1–1.0)
Monocytes Relative: 8.1 % (ref 3.0–12.0)
Neutro Abs: 4.2 K/uL (ref 1.4–7.7)
Neutrophils Relative %: 64.4 % (ref 43.0–77.0)
Platelets: 245 K/uL (ref 150.0–400.0)
RBC: 4.72 Mil/uL (ref 3.87–5.11)
RDW: 13.7 % (ref 11.5–15.5)
WBC: 6.5 K/uL (ref 4.0–10.5)

## 2024-03-23 LAB — BASIC METABOLIC PANEL WITH GFR
BUN: 15 mg/dL (ref 6–23)
CO2: 29 meq/L (ref 19–32)
Calcium: 9.4 mg/dL (ref 8.4–10.5)
Chloride: 103 meq/L (ref 96–112)
Creatinine, Ser: 0.69 mg/dL (ref 0.40–1.20)
GFR: 97.95 mL/min (ref >60.00)
Glucose, Bld: 84 mg/dL (ref 70–99)
Potassium: 3.8 meq/L (ref 3.5–5.1)
Sodium: 139 meq/L (ref 135–145)

## 2024-03-23 LAB — LIPID PANEL
Cholesterol: 196 mg/dL (ref 0–200)
HDL: 42.1 mg/dL (ref >39.00)
LDL Cholesterol: 114 mg/dL — ABNORMAL HIGH (ref 0–99)
NonHDL: 153.63
Total CHOL/HDL Ratio: 5
Triglycerides: 197 mg/dL — ABNORMAL HIGH (ref 0.0–149.0)
VLDL: 39.4 mg/dL (ref 0.0–40.0)

## 2024-03-23 LAB — HEPATIC FUNCTION PANEL
ALT: 18 U/L (ref 0–35)
AST: 16 U/L (ref 0–37)
Albumin: 4.6 g/dL (ref 3.5–5.2)
Alkaline Phosphatase: 69 U/L (ref 39–117)
Bilirubin, Direct: 0.1 mg/dL (ref 0.0–0.3)
Total Bilirubin: 0.6 mg/dL (ref 0.2–1.2)
Total Protein: 7.5 g/dL (ref 6.0–8.3)

## 2024-03-23 LAB — TSH: TSH: 1.76 u[IU]/mL (ref 0.35–5.50)

## 2024-03-23 MED ORDER — LISINOPRIL-HYDROCHLOROTHIAZIDE 10-12.5 MG PO TABS: 1.0000 | ORAL_TABLET | Freq: Every day | ORAL | 1 refills | Status: AC

## 2024-03-23 NOTE — Progress Notes (Signed)
   Subjective:    Patient ID: Stacy Rivers, female    DOB: Jan 29, 1971, 53 y.o.   MRN: [id number]  HPI New to establish.  No recent PCP.  HTN- chronic problem, on Lisinopril hydrochlorothiazide 10/12.5mg  daily.  Denies CP, SOB, HA's, visual changes.   Obesity- currently 191 lbs.  BMI 30.87.  Walking regularly.   Review of Systems For ROS see HPI     Objective:   Physical Exam Vitals reviewed.  Constitutional:      General: She is not in acute distress.    Appearance: Normal appearance. She is well-developed. She is not ill-appearing.  HENT:     Head: Normocephalic and atraumatic.  Eyes:     Conjunctiva/sclera: Conjunctivae normal.     Pupils: Pupils are equal, round, and reactive to light.  Neck:     Thyroid: No thyromegaly.  Cardiovascular:     Rate and Rhythm: Normal rate and regular rhythm.     Pulses: Normal pulses.     Heart sounds: Normal heart sounds. No murmur heard. Pulmonary:     Effort: Pulmonary effort is normal. No respiratory distress.     Breath sounds: Normal breath sounds.  Abdominal:     General: There is no distension.     Palpations: Abdomen is soft.     Tenderness: There is no abdominal tenderness.  Musculoskeletal:     Cervical back: Normal range of motion and neck supple.     Right lower leg: No edema.     Left lower leg: No edema.  Lymphadenopathy:     Cervical: No cervical adenopathy.  Skin:    General: Skin is warm and dry.  Neurological:     General: No focal deficit present.     Mental Status: She is alert and oriented to person, place, and time.  Psychiatric:        Mood and Affect: Mood normal.        Behavior: Behavior normal.        Thought Content: Thought content normal.           Assessment & Plan:

## 2024-03-23 NOTE — Assessment & Plan Note (Signed)
 New to provider, ongoing for pt.  Currently asymptomatic on Lisinopril hydrochlorothiazide and BP is well controlled.  Check labs due to ACE and diuretic use.  No anticipated med changes.  Will follow.

## 2024-03-23 NOTE — Assessment & Plan Note (Signed)
 New to provider, ongoing for pt.  Current BMI 30.87.  Walking regularly.  Applauded her efforts.  Check labs to risk stratify.  Will follow.

## 2024-03-23 NOTE — Patient Instructions (Signed)
Schedule your complete physical in 6 months We'll notify you of your lab results and make any changes if needed Keep up the good work on healthy diet and regular exercise- you look great! Call with any questions or concerns Stay Safe! Stay Healthy!! Welcome!  We're glad to have you!!

## 2024-03-24 ENCOUNTER — Ambulatory Visit: Payer: Self-pay | Admitting: Family Medicine

## 2024-03-24 LAB — HIV ANTIBODY (ROUTINE TESTING W REFLEX)
HIV 1&2 Ab, 4th Generation: NONREACTIVE
HIV FINAL INTERPRETATION: NEGATIVE

## 2024-03-24 LAB — HEPATITIS C ANTIBODY: Hepatitis C Ab: NONREACTIVE

## 2024-03-24 NOTE — Progress Notes (Signed)
 Lab results have been discussed.   Verbalized understanding? Yes  Are there any questions? No

## 2024-09-21 ENCOUNTER — Ambulatory Visit: Admitting: Family Medicine
